# Patient Record
Sex: Female | Born: 1965 | Race: White | Hispanic: No | Marital: Married | Smoking: Never smoker
Health system: Southern US, Community
[De-identification: ages and names within clinical notes are randomized; demographics above are authoritative.]

## PROBLEM LIST (undated history)

## (undated) DIAGNOSIS — R51 Headache: Secondary | ICD-10-CM

## (undated) DIAGNOSIS — R42 Dizziness and giddiness: Secondary | ICD-10-CM

## (undated) DIAGNOSIS — K219 Gastro-esophageal reflux disease without esophagitis: Secondary | ICD-10-CM

## (undated) DIAGNOSIS — A048 Other specified bacterial intestinal infections: Secondary | ICD-10-CM

## (undated) DIAGNOSIS — R519 Headache, unspecified: Secondary | ICD-10-CM

## (undated) DIAGNOSIS — E785 Hyperlipidemia, unspecified: Secondary | ICD-10-CM

## (undated) DIAGNOSIS — E559 Vitamin D deficiency, unspecified: Secondary | ICD-10-CM

## (undated) DIAGNOSIS — N979 Female infertility, unspecified: Secondary | ICD-10-CM

## (undated) DIAGNOSIS — G43909 Migraine, unspecified, not intractable, without status migrainosus: Secondary | ICD-10-CM

## (undated) DIAGNOSIS — H409 Unspecified glaucoma: Secondary | ICD-10-CM

## (undated) HISTORY — DX: Migraine, unspecified, not intractable, without status migrainosus: G43.909

## (undated) HISTORY — DX: Headache: R51

## (undated) HISTORY — DX: Headache, unspecified: R51.9

## (undated) HISTORY — DX: Vitamin D deficiency, unspecified: E55.9

## (undated) HISTORY — DX: Gastro-esophageal reflux disease without esophagitis: K21.9

## (undated) HISTORY — DX: Female infertility, unspecified: N97.9

## (undated) HISTORY — DX: Hyperlipidemia, unspecified: E78.5

## (undated) HISTORY — PX: MANDIBLE SURGERY: SHX707

## (undated) HISTORY — DX: Dizziness and giddiness: R42

## (undated) HISTORY — DX: Unspecified glaucoma: H40.9

## (undated) HISTORY — DX: Hypercalcemia: E83.52

## (undated) HISTORY — PX: HAND SURGERY: SHX662

## (undated) HISTORY — DX: Other specified bacterial intestinal infections: A04.8

---

## 1997-07-30 ENCOUNTER — Other Ambulatory Visit: Admission: RE | Admit: 1997-07-30 | Discharge: 1997-07-30 | Payer: Self-pay | Admitting: Obstetrics and Gynecology

## 1998-09-17 ENCOUNTER — Other Ambulatory Visit: Admission: RE | Admit: 1998-09-17 | Discharge: 1998-09-17 | Payer: Self-pay | Admitting: Obstetrics and Gynecology

## 1999-07-07 ENCOUNTER — Encounter (INDEPENDENT_AMBULATORY_CARE_PROVIDER_SITE_OTHER): Payer: Self-pay | Admitting: Specialist

## 1999-07-07 ENCOUNTER — Ambulatory Visit (HOSPITAL_COMMUNITY): Admission: RE | Admit: 1999-07-07 | Discharge: 1999-07-07 | Payer: Self-pay | Admitting: Oral Surgery

## 1999-09-08 ENCOUNTER — Encounter: Payer: Self-pay | Admitting: Obstetrics and Gynecology

## 1999-09-08 ENCOUNTER — Encounter: Admission: RE | Admit: 1999-09-08 | Discharge: 1999-09-08 | Payer: Self-pay | Admitting: Obstetrics and Gynecology

## 1999-10-29 ENCOUNTER — Other Ambulatory Visit: Admission: RE | Admit: 1999-10-29 | Discharge: 1999-10-29 | Payer: Self-pay | Admitting: Obstetrics and Gynecology

## 2000-03-03 ENCOUNTER — Other Ambulatory Visit: Admission: RE | Admit: 2000-03-03 | Discharge: 2000-03-03 | Payer: Self-pay | Admitting: Orthopedic Surgery

## 2001-05-29 ENCOUNTER — Other Ambulatory Visit: Admission: RE | Admit: 2001-05-29 | Discharge: 2001-05-29 | Payer: Self-pay | Admitting: Obstetrics & Gynecology

## 2001-06-09 ENCOUNTER — Encounter: Payer: Self-pay | Admitting: Obstetrics & Gynecology

## 2001-06-09 ENCOUNTER — Ambulatory Visit (HOSPITAL_COMMUNITY): Admission: RE | Admit: 2001-06-09 | Discharge: 2001-06-09 | Payer: Self-pay | Admitting: Obstetrics & Gynecology

## 2002-05-28 ENCOUNTER — Other Ambulatory Visit: Admission: RE | Admit: 2002-05-28 | Discharge: 2002-05-28 | Payer: Self-pay | Admitting: Obstetrics & Gynecology

## 2003-12-02 ENCOUNTER — Encounter: Admission: RE | Admit: 2003-12-02 | Discharge: 2003-12-02 | Payer: Self-pay | Admitting: Internal Medicine

## 2006-06-30 ENCOUNTER — Ambulatory Visit (HOSPITAL_BASED_OUTPATIENT_CLINIC_OR_DEPARTMENT_OTHER): Admission: RE | Admit: 2006-06-30 | Discharge: 2006-06-30 | Payer: Self-pay | Admitting: Orthopedic Surgery

## 2006-09-08 ENCOUNTER — Ambulatory Visit (HOSPITAL_BASED_OUTPATIENT_CLINIC_OR_DEPARTMENT_OTHER): Admission: RE | Admit: 2006-09-08 | Discharge: 2006-09-08 | Payer: Self-pay | Admitting: Orthopedic Surgery

## 2006-09-08 ENCOUNTER — Encounter (INDEPENDENT_AMBULATORY_CARE_PROVIDER_SITE_OTHER): Payer: Self-pay | Admitting: Orthopedic Surgery

## 2006-10-06 DIAGNOSIS — H409 Unspecified glaucoma: Secondary | ICD-10-CM | POA: Insufficient documentation

## 2006-10-06 DIAGNOSIS — E7800 Pure hypercholesterolemia, unspecified: Secondary | ICD-10-CM | POA: Insufficient documentation

## 2006-10-06 DIAGNOSIS — N979 Female infertility, unspecified: Secondary | ICD-10-CM | POA: Insufficient documentation

## 2006-10-06 HISTORY — DX: Female infertility, unspecified: N97.9

## 2006-10-07 DIAGNOSIS — J309 Allergic rhinitis, unspecified: Secondary | ICD-10-CM | POA: Insufficient documentation

## 2006-10-20 ENCOUNTER — Ambulatory Visit: Payer: Self-pay | Admitting: Family Medicine

## 2008-08-20 ENCOUNTER — Emergency Department (HOSPITAL_COMMUNITY): Admission: EM | Admit: 2008-08-20 | Discharge: 2008-08-21 | Payer: Self-pay | Admitting: Emergency Medicine

## 2008-08-22 ENCOUNTER — Ambulatory Visit: Payer: Self-pay

## 2008-08-31 ENCOUNTER — Ambulatory Visit: Payer: Self-pay | Admitting: Unknown Physician Specialty

## 2008-09-11 ENCOUNTER — Inpatient Hospital Stay: Payer: Self-pay | Admitting: Internal Medicine

## 2008-10-28 ENCOUNTER — Encounter: Payer: Self-pay | Admitting: Neurology

## 2008-10-30 ENCOUNTER — Encounter: Payer: Self-pay | Admitting: Neurology

## 2008-11-29 ENCOUNTER — Encounter: Payer: Self-pay | Admitting: Neurology

## 2008-12-30 ENCOUNTER — Encounter: Payer: Self-pay | Admitting: Neurology

## 2009-01-29 ENCOUNTER — Encounter: Payer: Self-pay | Admitting: Neurology

## 2009-10-20 ENCOUNTER — Other Ambulatory Visit: Admission: RE | Admit: 2009-10-20 | Discharge: 2009-10-20 | Payer: Self-pay | Admitting: Obstetrics and Gynecology

## 2009-11-04 ENCOUNTER — Encounter: Admission: RE | Admit: 2009-11-04 | Discharge: 2009-11-04 | Payer: Self-pay | Admitting: Obstetrics and Gynecology

## 2010-07-14 NOTE — Op Note (Signed)
NAMEJESALYN, Chelsea Jefferson             ACCOUNT NO.:  [id number]   MEDICAL RECORD NO.:  [id number]          PATIENT TYPE:  AMB   LOCATION:  DSC                          FACILITY:  [facility]   PHYSICIAN:  Katy Fitch. Sypher, M.D. DATE OF BIRTH:  April 08, 1965   DATE OF PROCEDURE:  09/08/2006  DATE OF DISCHARGE:                               OPERATIVE REPORT   PREOPERATIVE DIAGNOSIS:  Keloid scar with inclusion cyst, right index  finger, status post excisional biopsy of a vascular mass, right index  finger, performed Jun 30, 2006.   POSTOPERATIVE DIAGNOSIS:  Keloid scar with inclusion cyst, right index  finger, status post excisional biopsy of a vascular mass, right index  finger, performed Jun 30, 2006.   OPERATION:  Full-thickness skin biopsy with removal of keloid scar and  an inclusion cyst, right index finger.   OPERATING SURGEON:  Josephine Igo, MD   ASSISTANT:  Annye Rusk, PA-C   ANESTHESIA:  Lidocaine 2% metacarpal head level block of right index  finger.  No sedation was provided, as this was performed in the minor  operating room.   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  She  subsequently returned with masses on the palmar aspect of her left index  finger at the PIP flexion crease overlying the flexion sheath and the  right index finger at the PIP flexion crease.   She was taken to the minor operating room at [facility] and under  local digital block anesthesia of the right and left index fingers on  Jun 30, 2006, had excisional biopsy of her masses.   In an effort to simplify her postoperative care, we used Vicryl Rapide  as a postoperative suture to close the wounds.   The goal in utilizing this suture was to avoid suture removal from  tender palmar wounds in the office postoperatively.   Unfortunately, Ms.  Jefferson had rather exuberant inflammatory reaction  to the Vicryl Rapide and developed what appeared to be an inclusion cyst  in the right index finger.   She initially brought this to my attention approximately 3 weeks  postoperatively.  I advised observing this for a minimum of 10 weeks.   In the left index finger, the exuberant reaction subsided and she only  has a small amount of dermal thickening at the site of her incision.   In the right index finger, there is a keloid at the apex of the Brunner  zigzag incision utilized to remove the mass at the PIP flexion crease  and what appears to be an inclusion cyst at the site of one Vicryl  Rapide sutures.   This was bothersome to Chelsea Jefferson and she requested that this be  resected.   Once again, we return to the minor operating room.   At this point in time, I advised her I would close her wound with either  a Steri-Strip or a intradermal Prolene suture.   After informed  consent, she is brought to the operating room at this  time.   Preoperatively, she was reminded that we cannot control keloid  formation.  It does appear, however, that she had a rather unusual  response to the Vicryl suture.  We intend to biopsy the lesion at this  time.   PROCEDURE:  Chelsea Jefferson was brought to the minor operating room and  placed in supine position upon the operating table.   Following placement of a 2% lidocaine metacarpal head level block,  excellent anesthesia of the right index finger was obtained.   The right hand and forearm were prepped with Betadine soap and solution  and sterilely draped.  A pneumatic tourniquet was applied to the  proximal forearm.   On exsanguination of the right hand and arm by direct compression, the  tourniquet was initially elevated to 240 mmHg and later to 260 mmHg due  to breakthrough bleeding.   An elliptical excision of the keloid and cyst was accomplished with a 15  scalpel blade, taking care  to identify the ulnar proper digital nerve,  which was subjacent to the mass.  The full-thickness skin and the keloid  and cysts were excised en bloc followed by careful inspection of the  ulnar proper digital nerve and vessel.   The wound margins were mobilized with gentle tenotomy scissors  dissection followed by repair the skin wound with an intradermal 4-0  Prolene suture and an half-inch Steri-Strip.   The wound was dressed with sterile gauze and Coban.  There were no  apparent complications.   Chelsea Jefferson tolerated the procedure well.  She was transferred back to  the holding area for observation of her vital signs, anticipating  discharge home.   She chooses to use over-the-counter analgesic medication rather than a  prescription for postoperative pain.      Katy Fitch Sypher, M.D.  Electronically Signed     RVS/MEDQ  D:  09/08/2006  T:  09/09/2006  Job:  [id number]

## 2010-12-19 IMAGING — CT CT HEAD WITHOUT AND WITH CONTRAST
1 of 2 series · 13 of 30 positions shown, 17 images · non-contrast
Comparison: none

REASON FOR EXAM: [id number] vertigo nausea vomiting eval tumor preg test
negative
COMMENTS:

[Series 2: soft tissue wo · axial · 0.38mm/px · z∈[+687,+807]mm · 13 of 28 slices shown, 17 images]
[im 2/28  brain]
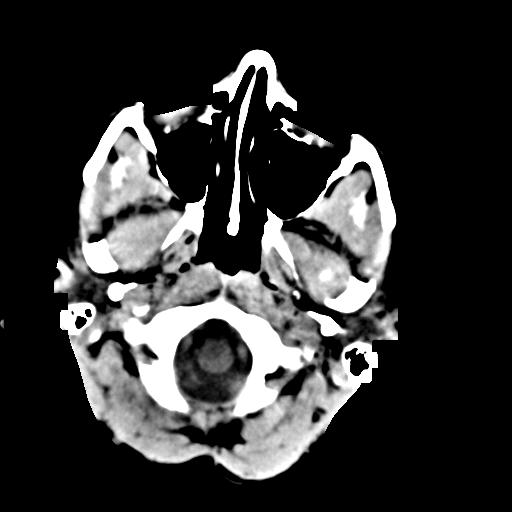
[im 2/28  bone]
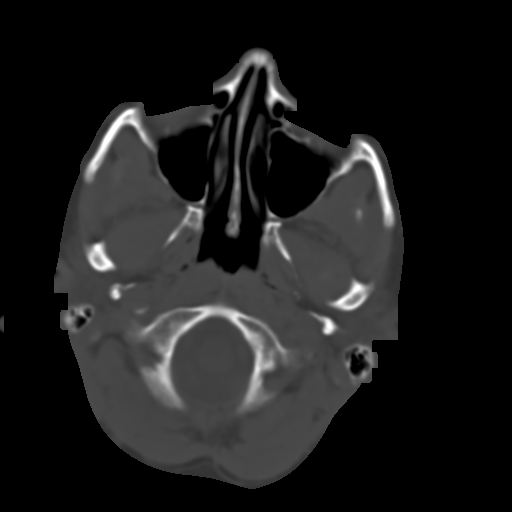
[im 4/28  brain]
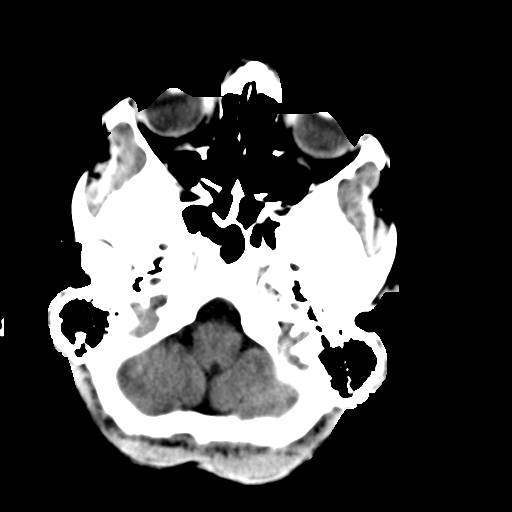
[im 6/28  brain]
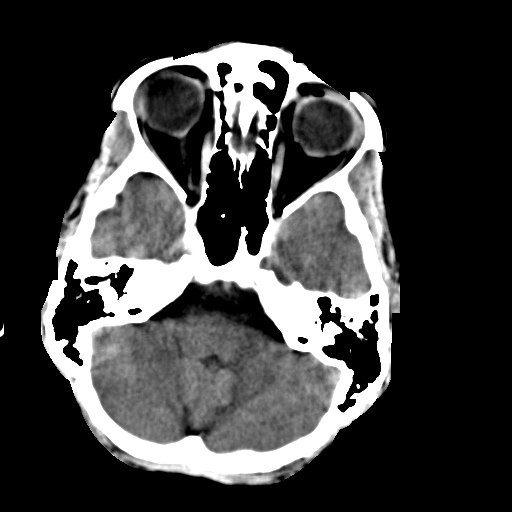
[im 8/28  brain]
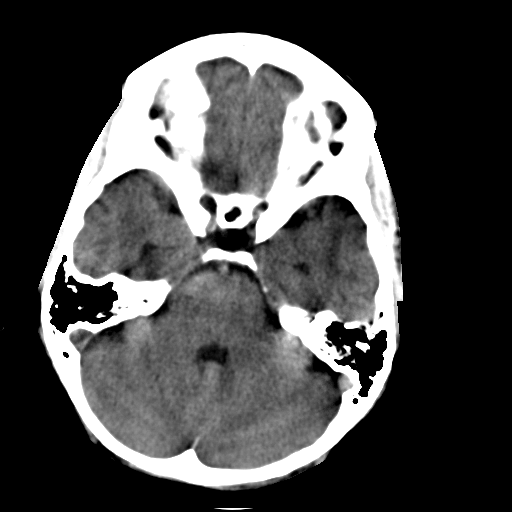
[im 10/28  brain]
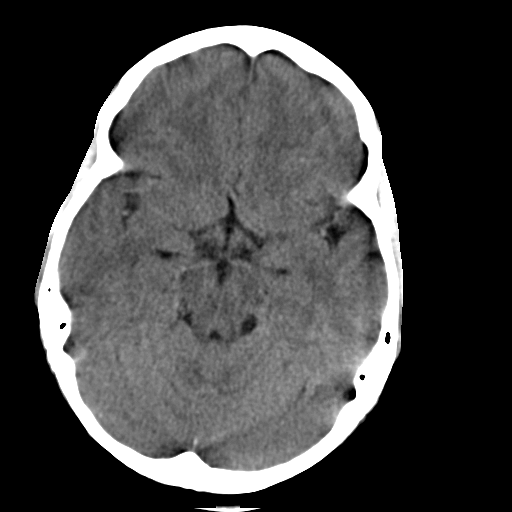
[im 10/28  bone]
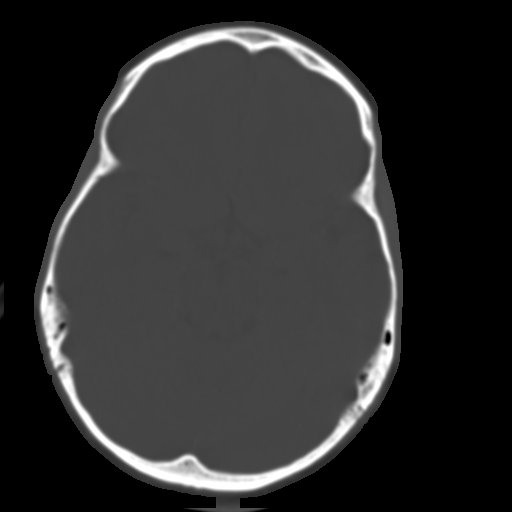
[im 12/28  brain]
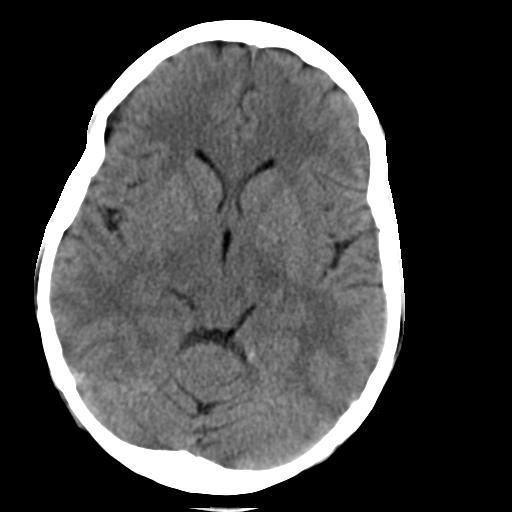
[im 14/28  brain]
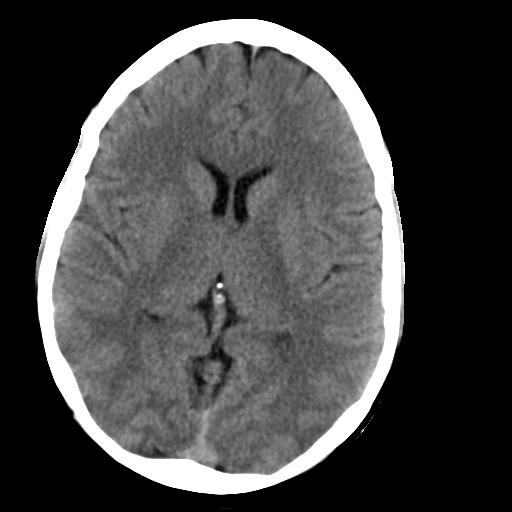
[im 16/28  brain]
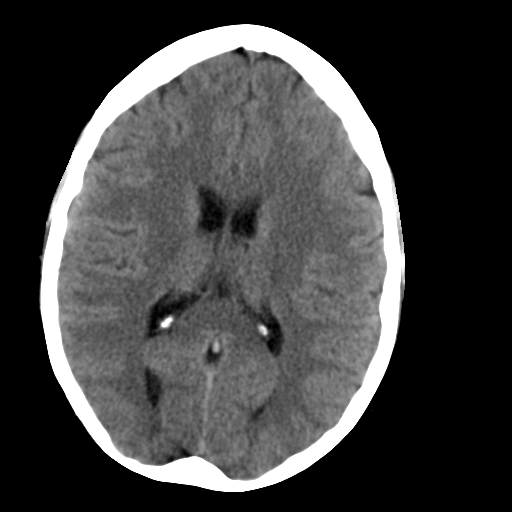
[im 18/28  brain]
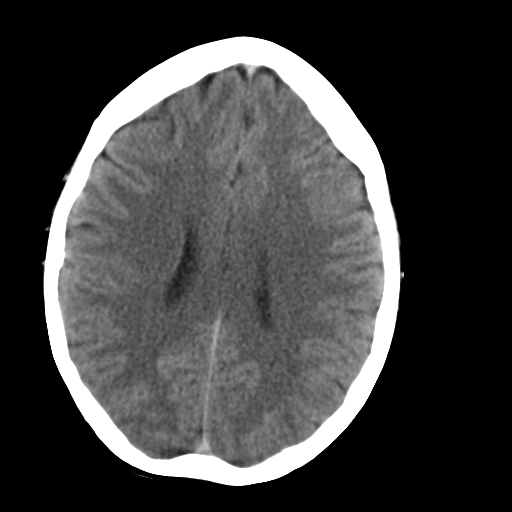
[im 18/28  bone]
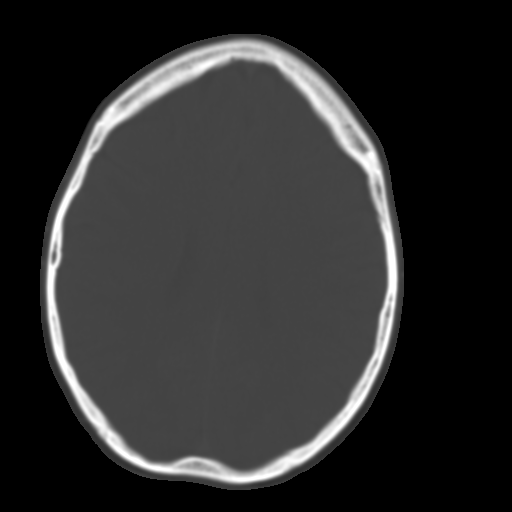
[im 20/28  brain]
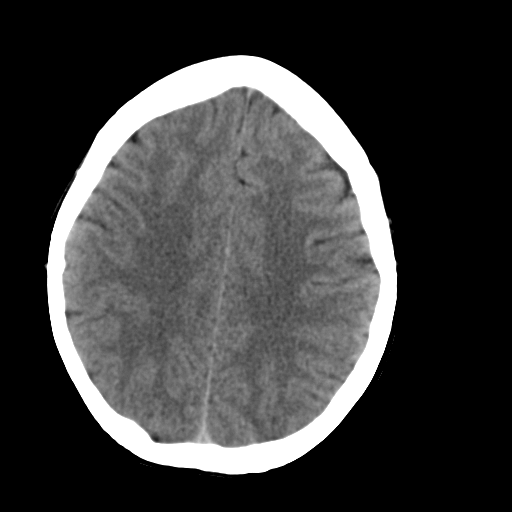
[im 22/28  brain]
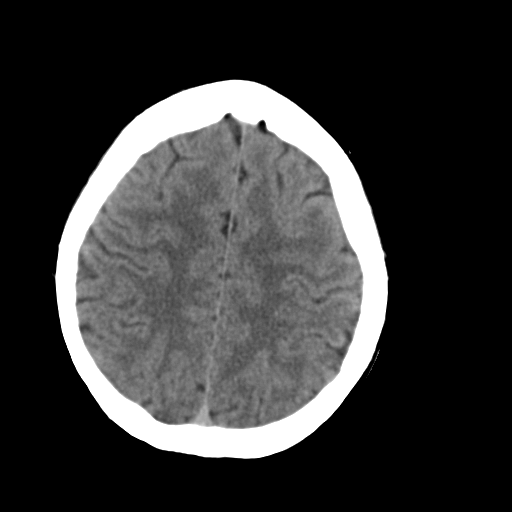
[im 24/28  brain]
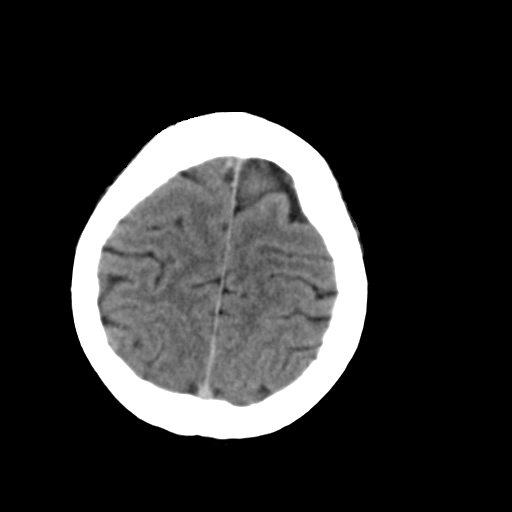
[im 26/28  brain]
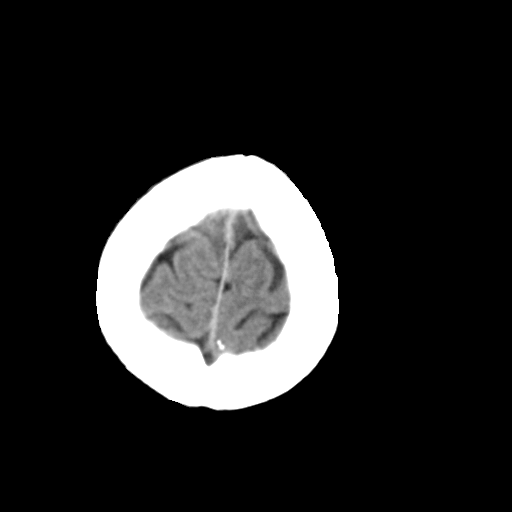
[im 26/28  bone]
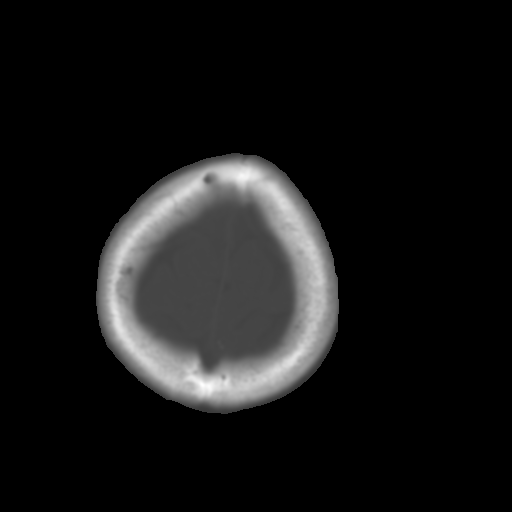

[13 of 30 positions shown; findings below may reference images not displayed]

PROCEDURE:     AUJLA - JOZOTRIM BAGISLAR/ANA FULARIANI  - August 22, 2008  [DATE]

RESULT:     History: Vertigo.

Comparison studies no prior.

Procedure and findings: No intra-axial or extra-axial pathologic fluid or
blood collections identified. No mass lesions noted. No hydrocephalus. No
enhancing lesions noted. No bony abnormalities noted.
IMPRESSION: No acute abnormality. No mass lesion or enhancing lesion.

## 2012-10-27 ENCOUNTER — Ambulatory Visit (INDEPENDENT_AMBULATORY_CARE_PROVIDER_SITE_OTHER): Payer: 59 | Admitting: Family Medicine

## 2012-10-27 ENCOUNTER — Encounter: Payer: Self-pay | Admitting: Family Medicine

## 2012-10-27 VITALS — BP 144/98 | HR 73 | Ht 63.0 in | Wt 146.0 lb

## 2012-10-27 DIAGNOSIS — Z803 Family history of malignant neoplasm of breast: Secondary | ICD-10-CM | POA: Insufficient documentation

## 2012-10-27 DIAGNOSIS — R42 Dizziness and giddiness: Secondary | ICD-10-CM | POA: Insufficient documentation

## 2012-10-27 NOTE — Progress Notes (Signed)
Patient ID: NHYLA NAPPI, female   DOB: 1965/05/29, 47 y.o.   MRN: [id number] Pt. Is new today a G0 and comes in seeking a third opinion.  Apparently, saw OB/GYN # 1 for annual exam with nml pap.  While there, felt pelvic mass and scheduled pt. For u/s.  U/s revealed nml ovarian cyst and ? Thickened endometrium at 8 mm on day 12 of cycle.  For this reason, pt. Scheduled for EMB.  OB/GYN #1 could not obtain sample and tried for u/s guidance. On that u/s noted "cervical/uterine mass 2 x  4 cm and advised to have hysterectomy.  Pt. Reports regular cycles which have gotten closer in length q 21-23 days but are very light. Pt. Was uncomfortable with this recommendation and opted for 2nd opinion.   OB/GYN # 2 saw pt in office and performed 3rd pelvic sono with endometrial strip of 6 mm and no mass noted.  OB/GYN # 2 advised no further work-up warranted. OB/GYN # 1 has called pt's home and advised again she undergo hysterectomy.  Since opinion #1 was so divergent from opinion #2, pt comes in for 3rd opinion as a tie breaker.  I have reviewed and updated the patient's past medical, surgical, social, OB, family history.  We reviewed medications and allergies.  After hearing the story, I have a similar opinion to OB/GYN #2.  Without complaints of heavy menstrual bleeding, EMB is usually not warranted. An endometrial thickness of 6-8 mm in a pre-menopausal patient is within the range of normal.  2 ultrasounds failed to show a cervical/uterine mass.  Hysterectomy would not be warranted.  If there is fear of mass, which there isn't, hysteroscopy with sampling could be performed to ensure there was not any cancer, which would need referral to GYN/Onc.  Cycle length typically begins to vary in the mid-forties as part of the peri-menopausal hormonal fluctuations and cycle shortening is often the first sign of this.  I have discussed this at length with the patient today.  I do not think further imaging or work-up is  warranted at this time.  She should obtain her annual mammogram.

## 2012-10-27 NOTE — Patient Instructions (Signed)
Perimenopause Perimenopause is the time when your body begins to move into the menopause (no menstrual period for 12 straight months). It is a natural process. Perimenopause can begin 2 to 8 years before the menopause and usually lasts for one year after the menopause. During this time, your ovaries may or may not produce an egg. The ovaries vary in their production of estrogen and progesterone hormones each month. This can cause irregular menstrual periods, difficulty in getting pregnant, vaginal bleeding between periods and uncomfortable symptoms. CAUSES  Irregular production of the ovarian hormones, estrogen and progesterone, and not ovulating every month.  Other causes include:  Tumor of the pituitary gland in the brain.  Medical disease that affects the ovaries.  Radiation treatment.  Chemotherapy.  Unknown causes.  Heavy smoking and excessive alcohol intake can bring on perimenopause sooner. SYMPTOMS   Hot flashes.  Night sweats.  Irregular menstrual periods.  Decrease sex drive.  Vaginal dryness.  Headaches.  Mood swings.  Depression.  Memory problems.  Irritability.  Tiredness.  Weight gain.  Trouble getting pregnant.  The beginning of losing bone cells (osteoporosis).  The beginning of hardening of the arteries (atherosclerosis). DIAGNOSIS  Your caregiver will make a diagnosis by analyzing your age, menstrual history and your symptoms. They will do a physical exam noting any changes in your body, especially your female organs. Female hormone tests may or may not be helpful depending on the amount and when you produce the female hormones. However, other hormone tests may be helpful (ex. thyroid hormone) to rule out other problems. TREATMENT  The decision to treat during the perimenopause should be made by you and your caregiver depending on how the symptoms are affecting you and your life style. There are various treatments available such as:  Treating  individual symptoms with a specific medication for that symptom (ex. tranquilizer for depression).  Herbal medications that can help specific symptoms.  Counseling.  Group therapy.  No treatment. HOME CARE INSTRUCTIONS   Before seeing your caregiver, make a list of your menstrual periods (when the occur, how heavy they are, how long between periods and how long they last), your symptoms and when they started.  Take the medication as recommended by your caregiver.  Sleep and rest.  Exercise.  Eat a diet that contains calcium (good for your bones) and soy (acts like estrogen hormone).  Do not smoke.  Avoid alcoholic beverages.  Taking vitamin E may help in certain cases.  Take calcium and vitamin D supplements to help prevent bone loss.  Group therapy is sometimes helpful.  Acupuncture may help in some cases. SEEK MEDICAL CARE IF:   You have any of the above and want to know if it is perimenopause.  You want advice and treatment for any of your symptoms mentioned above.  You need a referral to a specialist (gynecologist, psychiatrist or psychologist). SEEK IMMEDIATE MEDICAL CARE IF:   You have vaginal bleeding.  Your period lasts longer than 8 days.  You periods are recurring sooner than 21 days.  You have bleeding after intercourse.  You have severe depression.  You have pain when you urinate.  You have severe headaches.  You develop vision problems. Document Released: 03/25/2004 Document Revised: 05/10/2011 Document Reviewed: 12/14/2007 ExitCare Patient Information 2014 [employer].  

## 2014-01-17 LAB — BASIC METABOLIC PANEL
BUN: 9 mg/dL (ref 4–21)
Creatinine: 0.9 mg/dL (ref 0.5–1.1)
Glucose: 117 mg/dL
Potassium: 4.2 mmol/L (ref 3.4–5.3)
SODIUM: 138 mmol/L (ref 137–147)

## 2014-01-17 LAB — HEPATIC FUNCTION PANEL
ALT: 10 U/L (ref 7–35)
AST: 12 U/L — AB (ref 13–35)

## 2014-01-17 LAB — CBC AND DIFFERENTIAL
HEMATOCRIT: 45 % (ref 36–46)
HEMOGLOBIN: 15.9 g/dL (ref 12.0–16.0)
PLATELETS: 286 10*3/uL (ref 150–399)
WBC: 8.7 10^3/mL

## 2014-01-21 ENCOUNTER — Ambulatory Visit: Payer: Self-pay | Admitting: Family Medicine

## 2014-12-04 ENCOUNTER — Other Ambulatory Visit: Payer: Self-pay | Admitting: Family Medicine

## 2014-12-04 NOTE — Telephone Encounter (Signed)
Controlled, needs your approval.  Thanks  ED

## 2014-12-04 NOTE — Telephone Encounter (Signed)
Last appointment or next appointment?

## 2014-12-06 NOTE — Telephone Encounter (Signed)
LOV was 05/2014 for acute visit and the time before November 2015 for acute visit, no future appointments in the books-aa

## 2014-12-06 NOTE — Telephone Encounter (Signed)
Needs appointment

## 2014-12-16 ENCOUNTER — Other Ambulatory Visit: Payer: Self-pay | Admitting: Family Medicine

## 2014-12-16 NOTE — Telephone Encounter (Signed)
controlled 

## 2014-12-17 NOTE — Telephone Encounter (Signed)
Last appt:   Next appt:

## 2014-12-17 NOTE — Telephone Encounter (Signed)
Last appointment was in April, no future appointment on books.

## 2015-05-20 ENCOUNTER — Encounter: Payer: Self-pay | Admitting: Obstetrics and Gynecology

## 2015-05-20 ENCOUNTER — Other Ambulatory Visit: Payer: Self-pay | Admitting: Obstetrics and Gynecology

## 2015-05-20 ENCOUNTER — Ambulatory Visit (INDEPENDENT_AMBULATORY_CARE_PROVIDER_SITE_OTHER): Payer: BLUE CROSS/BLUE SHIELD | Admitting: Obstetrics and Gynecology

## 2015-05-20 VITALS — BP 132/98 | HR 98 | Ht 63.0 in | Wt 154.4 lb

## 2015-05-20 DIAGNOSIS — Z01419 Encounter for gynecological examination (general) (routine) without abnormal findings: Secondary | ICD-10-CM

## 2015-05-20 DIAGNOSIS — N951 Menopausal and female climacteric states: Secondary | ICD-10-CM

## 2015-05-20 DIAGNOSIS — H8190 Unspecified disorder of vestibular function, unspecified ear: Secondary | ICD-10-CM | POA: Diagnosis not present

## 2015-05-20 DIAGNOSIS — E663 Overweight: Secondary | ICD-10-CM

## 2015-05-20 NOTE — Patient Instructions (Signed)
  Place annual gynecologic exam patient instructions here.  Thank you for enrolling in MyChart. Please follow the instructions below to securely access your online medical record. MyChart allows you to send messages to your doctor, view your test results, manage appointments, and more.   How Do I Sign Up? 1. In your Internet browser, go to [employer] Address [employer] and enter [email or web address]. 2. Click on the Sign Up Now link in the Sign In box. You will see the New Member Sign Up page. 3. Enter your MyChart Access Code exactly as it appears below. You will not need to use this code after you've completed the sign-up process. If you do not sign up before the expiration date, you must request a new code.  MyChart Access Code: [email or web address] Expires: 06/09/2015  3:04 PM  4. Enter your Social Security Number ([id number]) and Date of Birth (mm/dd/yyyy) as indicated and click Submit. You will be taken to the next sign-up page. 5. Create a MyChart ID. This will be your MyChart login ID and cannot be changed, so think of one that is secure and easy to remember. 6. Create a MyChart password. You can change your password at any time. 7. Enter your Password Reset Question and Answer. This can be used at a later time if you forget your password.  8. Enter your e-mail address. You will receive e-mail notification when new information is available in MyChart. 9. Click Sign Up. You can now view your medical record.   Additional Information Remember, MyChart is NOT to be used for urgent needs. For medical emergencies, dial 911.

## 2015-05-20 NOTE — Progress Notes (Signed)
Subjective:   Chelsea Jefferson is a 50 y.o. G0P0000 Caucasian female here for a routine well-woman exam.  Patient's last menstrual period was 05/07/2015.    Current complaints: severe vertigo- unknown etiology- now OOW, but homeschooling adopted daughter PCP: none       does desire labs  Social History: Sexual: heterosexual Marital Status: married Living situation: with spouse Occupation: Architectural technologistelementary ed teacher Tobacco/alcohol: no tobacco use Illicit drugs: no history of illicit drug use  The following portions of the patient's history were reviewed and updated as appropriate: allergies, current medications, past family history, past medical history, past social history, past surgical history and problem list.  Past Medical History Past Medical History  Diagnosis Date  . Headache   . Migraine   . Vertigo     Past Surgical History Past Surgical History  Procedure Laterality Date  . Mandible surgery    . Hand surgery      Gynecologic History G0P0000  Patient's last menstrual period was 05/07/2015. Contraception: none Last Pap: 2014. Results were: normal Last mammogram: 2014. Results were: normal, with increased fibrocystic tissue- been doing thermagraphic exams yearly  Obstetric History OB History  Gravida Para Term Preterm AB SAB TAB Ectopic Multiple Living  0 0 0 0 0 0 0 0 0 0         Current Medications Current Outpatient Prescriptions on File Prior to Visit  Medication Sig Dispense Refill  . aspirin-acetaminophen-caffeine (EXCEDRIN MIGRAINE) 250-250-65 MG per tablet Take 1 tablet by mouth every 6 (six) hours as needed for pain.    Marland Kitchen. LORazepam (ATIVAN) 0.5 MG tablet TAKE 1/2 TO 1 TABLET BY MOUTH TWICE A DAY AS NEEDED 60 tablet 0   No current facility-administered medications on file prior to visit.    Review of Systems Patient denies any headaches, blurred vision, shortness of breath, chest pain, abdominal pain, problems with bowel movements, urination, or  intercourse.  Objective:  BP 132/98 mmHg  Pulse 98  Ht 5\' 3"  (1.6 m)  Wt 154 lb 6.4 oz (70.035 kg)  BMI 27.36 kg/m2  LMP 05/07/2015 Physical Exam  General:  Well developed, well nourished, no acute distress. She is alert and oriented x3. Skin:  Warm and dry Neck:  Midline trachea, no thyromegaly or nodules Cardiovascular: Regular rate and rhythm, no murmur heard Lungs:  Effort normal, all lung fields clear to auscultation bilaterally Breasts:  No dominant palpable mass, retraction, or nipple discharge Abdomen:  Soft, non tender, no hepatosplenomegaly or masses Pelvic:  External genitalia is normal in appearance.  The vagina is normal in appearance. The cervix is bulbous, no CMT.  Thin prep pap is done with HR HPV cotesting. Uterus is felt to be normal size, shape, retroflexed .  No adnexal masses or tenderness noted. Extremities:  No swelling or varicosities noted Psych:  She has a normal mood and affect  Assessment:   Healthy well-woman exam Overweight Vertigo H/o vitamin d deficiency Family h/o breast cancer in sister & prostate cancer in brother Peri-menopausal bleeding  Plan:  Labs irdered Discussed MyRisks testing- will consider. F/U 1 year for AE, or sooner if needed Mammogram 3D scheduled  Bryor Rami Suzan NailerN Krista Godsil, CNM

## 2015-05-21 LAB — CYTOLOGY - PAP

## 2015-06-27 ENCOUNTER — Ambulatory Visit
Admission: RE | Admit: 2015-06-27 | Discharge: 2015-06-27 | Disposition: A | Discharge status: Home / Self Care | Payer: BLUE CROSS/BLUE SHIELD | Source: Ambulatory Visit | Attending: Obstetrics and Gynecology | Admitting: Obstetrics and Gynecology

## 2015-06-27 ENCOUNTER — Other Ambulatory Visit: Payer: Self-pay | Admitting: Obstetrics and Gynecology

## 2015-06-27 DIAGNOSIS — Z01419 Encounter for gynecological examination (general) (routine) without abnormal findings: Secondary | ICD-10-CM

## 2015-06-27 DIAGNOSIS — Z1231 Encounter for screening mammogram for malignant neoplasm of breast: Secondary | ICD-10-CM

## 2015-07-22 ENCOUNTER — Encounter: Payer: Self-pay | Admitting: Physician Assistant

## 2015-07-22 ENCOUNTER — Ambulatory Visit (INDEPENDENT_AMBULATORY_CARE_PROVIDER_SITE_OTHER): Payer: BLUE CROSS/BLUE SHIELD | Admitting: Physician Assistant

## 2015-07-22 VITALS — BP 130/80 | HR 72 | Temp 98.2°F | Resp 16 | Wt 141.6 lb

## 2015-07-22 DIAGNOSIS — E559 Vitamin D deficiency, unspecified: Secondary | ICD-10-CM | POA: Insufficient documentation

## 2015-07-22 DIAGNOSIS — K2100 Gastro-esophageal reflux disease with esophagitis, without bleeding: Secondary | ICD-10-CM | POA: Insufficient documentation

## 2015-07-22 DIAGNOSIS — A048 Other specified bacterial intestinal infections: Secondary | ICD-10-CM | POA: Insufficient documentation

## 2015-07-22 DIAGNOSIS — R42 Dizziness and giddiness: Secondary | ICD-10-CM

## 2015-07-22 MED ORDER — LORAZEPAM 0.5 MG PO TABS: ORAL_TABLET | ORAL | Status: DC

## 2015-07-22 NOTE — Patient Instructions (Signed)

## 2015-07-22 NOTE — Progress Notes (Signed)
       Patient: Chelsea Jefferson Female    DOB: November 12, 1965   50 y.o.   MRN: [id number] Visit Date: 07/22/2015  Today's Provider: Margaretann LovelessJennifer M Burnette, PA-C   No chief complaint on file.  Subjective:    HPIPatient here for Medication Refill on Lorazepam.  Vertigo - Dizziness: Patient is here for reill on the Lorazepam that she uses for vertigo to help her drive/daily function.  The dizziness has been present for daily it depends on the day but the medicine helps 80% of time. Sometimes she takes only half of the pills other one tablet. The patient describes the symptoms as disequalibirum and lightheadedness. Symptoms are exacerbated by none identified.   She has had this issue for 7 years. She has went through multiple tests and has completed vestibular rehab without relief.   No Known Allergies Previous Medications   ASPIRIN-ACETAMINOPHEN-CAFFEINE (EXCEDRIN MIGRAINE) 250-250-65 MG PER TABLET    Take 1 tablet by mouth every 6 (six) hours as needed for pain.   BIMATOPROST (LUMIGAN) 0.03 % OPHTHALMIC SOLUTION    Reported on 07/22/2015   LORAZEPAM (ATIVAN) 0.5 MG TABLET    TAKE 1/2 TO 1 TABLET BY MOUTH TWICE A DAY AS NEEDED   MULTIPLE VITAMIN PO    Take by mouth.    Review of Systems  Constitutional: Negative.   Respiratory: Negative for cough, chest tightness, shortness of breath and wheezing.   Cardiovascular: Negative for chest pain, palpitations and leg swelling.  Gastrointestinal: Negative for nausea, vomiting and abdominal pain.  Neurological: Positive for dizziness (just a little ttok half the pill). Negative for light-headedness and headaches.  Psychiatric/Behavioral: The patient is nervous/anxious (only when vertigo episodes hit and dont respond to medication).     Social History  Substance Use Topics  . Smoking status: Never Smoker   . Smokeless tobacco: Never Used  . Alcohol Use: No   Objective:   BP 130/80 mmHg  Pulse 72  Temp(Src) 98.2 F (36.8 C) (Oral)  Resp 16   Wt 141 lb 9.6 oz (64.229 kg)  LMP 07/20/2015  Physical Exam  Constitutional: She is oriented to person, place, and time. She appears well-developed and well-nourished. No distress.  Neck: Normal range of motion. Neck supple.  Cardiovascular: Normal rate, regular rhythm and normal heart sounds.  Exam reveals no gallop and no friction rub.   No murmur heard. Pulmonary/Chest: Effort normal and breath sounds normal. No respiratory distress. She has no wheezes. She has no rales.  Neurological: She is alert and oriented to person, place, and time. No cranial nerve deficit. Coordination normal.  Skin: She is not diaphoretic.  Vitals reviewed.       Assessment & Plan:     1. Vertigo Refilled medication as below. Continue current medical treatment plan. Call if symptoms worsen or if acute issues, questions or concerns arise.  - LORazepam (ATIVAN) 0.5 MG tablet; Take 1/2 tab to 1 tab PO prn vertigo  Dispense: 60 tablet; Refill: 5       Margaretann LovelessJennifer M Burnette, PA-C  [facility]

## 2016-01-19 ENCOUNTER — Other Ambulatory Visit: Payer: Self-pay | Admitting: Family Medicine

## 2016-01-19 DIAGNOSIS — R42 Dizziness and giddiness: Secondary | ICD-10-CM

## 2016-01-19 MED ORDER — LORAZEPAM 0.5 MG PO TABS: ORAL_TABLET | ORAL | 5 refills | Status: DC

## 2016-01-19 NOTE — Telephone Encounter (Signed)
Pt needs a new refill on her   LORazepam (ATIVAN) 0.5 MG tablet  She uses [facility]  Refills that exist but expired.  Thanks Barth Kirkseri

## 2016-01-19 NOTE — Telephone Encounter (Signed)
[facility] 

## 2016-01-19 NOTE — Telephone Encounter (Signed)
Last ov 07/22/2015

## 2016-05-21 ENCOUNTER — Encounter: Payer: BLUE CROSS/BLUE SHIELD | Admitting: Obstetrics and Gynecology

## 2016-09-24 ENCOUNTER — Encounter: Payer: Self-pay | Admitting: Physician Assistant

## 2016-09-24 ENCOUNTER — Ambulatory Visit (INDEPENDENT_AMBULATORY_CARE_PROVIDER_SITE_OTHER): Payer: BLUE CROSS/BLUE SHIELD | Admitting: Physician Assistant

## 2016-09-24 DIAGNOSIS — R42 Dizziness and giddiness: Secondary | ICD-10-CM | POA: Diagnosis not present

## 2016-09-24 MED ORDER — LORAZEPAM 0.5 MG PO TABS: ORAL_TABLET | ORAL | 5 refills | Status: DC

## 2016-09-24 NOTE — Progress Notes (Signed)
       Patient: Chelsea CallasDeanne C Jefferson Female    DOB: Jul 10, 1965   51 y.o.   MRN: [id number] Visit Date: 09/24/2016  Today's Provider: Margaretann LovelessJennifer M Anmarie Fukushima, PA-C   Chief Complaint  Patient presents with  . Follow-up   Subjective:    HPI Patient comes in today for a follow up. She was last seen in the office 1 year ago. Patient reports that she needs a refill on lorazepam 0.5mg  tablet. Patient states that [place] is moving to [place] next week and she will need a hard copy until she establishes with a primary doctor there.   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 She normally deals with baseline vertigo, but will take the lorazepam when she is having really bad days.     No Known Allergies   Current Outpatient Prescriptions:  .  LORazepam (ATIVAN) 0.5 MG tablet, Take 1/2 tab to 1 tab PO prn vertigo, Disp: 60 tablet, Rfl: 5 .  MULTIPLE VITAMIN PO, Take by mouth., Disp: , Rfl:  .  aspirin-acetaminophen-caffeine (EXCEDRIN MIGRAINE) 250-250-65 MG per tablet, Take 1 tablet by mouth every 6 (six) hours as needed for pain., Disp: , Rfl:  .  bimatoprost (LUMIGAN) 0.03 % ophthalmic solution, Reported on 07/22/2015, Disp: , Rfl:   Review of Systems  Constitutional: Negative.   Respiratory: Negative.   Cardiovascular: Negative.   Gastrointestinal: Negative.   Neurological: Positive for dizziness.  Psychiatric/Behavioral: Negative.     Social History  Substance Use Topics  . Smoking status: Never Smoker  . Smokeless tobacco: Never Used  . Alcohol use No   Objective:   BP 140/82   Temp 98.6 F (37 C)   Resp 16   Wt 134 lb (60.8 kg)   BMI 23.74 kg/m  Vitals:   09/24/16 1129  BP: 140/82  Resp: 16  Temp: 98.6 F (37 C)  Weight: 134 lb (60.8 kg)     Physical Exam  Constitutional: She is oriented to person, place, and time. She appears well-developed and well-nourished. No distress.  Neck: Normal range of motion. Neck supple.    Cardiovascular: Normal rate, regular rhythm and normal heart sounds.  Exam reveals no gallop and no friction rub.   No murmur heard. Pulmonary/Chest: Effort normal and breath sounds normal. No respiratory distress. She has no wheezes. She has no rales.  Neurological: She is alert and oriented to person, place, and time. No cranial nerve deficit.  Skin: She is not diaphoretic.  Vitals reviewed.       Assessment & Plan:     1. Vertigo Stable. Diagnosis pulled for medication refill. Continue current medical treatment plan. Patient is going to establish care [place]. - LORazepam (ATIVAN) 0.5 MG tablet; Take 1/2 tab to 1 tab PO BID prn vertigo  Dispense: 60 tablet; Refill: 5       Margaretann LovelessJennifer M Keno Caraway, PA-C  [facility]

## 2016-09-24 NOTE — Patient Instructions (Signed)
Dizziness Dizziness is a common problem. It is a feeling of unsteadiness or light-headedness. You may feel like you are about to faint. Dizziness can lead to injury if you stumble or fall. Anyone can become dizzy, but dizziness is more common in older adults. This condition can be caused by a number of things, including medicines, dehydration, or illness. Follow these instructions at home: Taking these steps may help with your condition: Eating and drinking   Drink enough fluid to keep your urine clear or pale yellow. This helps to keep you from becoming dehydrated. Try to drink more clear fluids, such as water.  Do not drink alcohol.  Limit your caffeine intake if directed by your health care provider.  Limit your salt intake if directed by your health care provider. Activity   Avoid making quick movements.  Rise slowly from chairs and steady yourself until you feel okay.  In the morning, first sit up on the side of the bed. When you feel okay, stand slowly while you hold onto something until you know that your balance is fine.  Move your legs often if you need to stand in one place for a long time. Tighten and relax your muscles in your legs while you are standing.  Do not drive or operate heavy machinery if you feel dizzy.  Avoid bending down if you feel dizzy. Place items in your home so that they are easy for you to reach without leaning over. Lifestyle   Do not use any tobacco products, including cigarettes, chewing tobacco, or electronic cigarettes. If you need help quitting, ask your health care provider.  Try to reduce your stress level, such as with yoga or meditation. Talk with your health care provider if you need help. General instructions   Watch your dizziness for any changes.  Take medicines only as directed by your health care provider. Talk with your health care provider if you think that your dizziness is caused by a medicine that you are taking.  Tell a friend  or a family member that you are feeling dizzy. If he or she notices any changes in your behavior, have this person call your health care provider.  Keep all follow-up visits as directed by your health care provider. This is important. Contact a health care provider if:  Your dizziness does not go away.  Your dizziness or light-headedness gets worse.  You feel nauseous.  You have reduced hearing.  You have new symptoms.  You are unsteady on your feet or you feel like the room is spinning. Get help right away if:  You vomit or have diarrhea and are unable to eat or drink anything.  You have problems talking, walking, swallowing, or using your arms, hands, or legs.  You feel generally weak.  You are not thinking clearly or you have trouble forming sentences. It may take a friend or family member to notice this.  You have chest pain, abdominal pain, shortness of breath, or sweating.  Your vision changes.  You notice any bleeding.  You have a headache.  You have neck pain or a stiff neck.  You have a fever. This information is not intended to replace advice given to you by your health care provider. Make sure you discuss any questions you have with your health care provider. Document Released: 08/11/2000 Document Revised: 07/24/2015 Document Reviewed: 02/11/2014 [employer] Interactive Patient Education  2017 [employer]  

## 2017-04-12 ENCOUNTER — Other Ambulatory Visit: Payer: Self-pay | Admitting: Physician Assistant

## 2017-04-12 DIAGNOSIS — R42 Dizziness and giddiness: Secondary | ICD-10-CM

## 2017-04-12 NOTE — Telephone Encounter (Signed)
Called into [facility] 

## 2017-10-12 ENCOUNTER — Other Ambulatory Visit: Payer: Self-pay | Admitting: Physician Assistant

## 2017-10-12 DIAGNOSIS — R42 Dizziness and giddiness: Secondary | ICD-10-CM

## 2017-10-12 NOTE — Telephone Encounter (Signed)
Patient needs appt

## 2017-10-23 IMAGING — MG MM SCREENING BREAST TOMO BILATERAL
8 of 12 series · 8 of 28 positions shown · non-contrast
Comparison: Previous exam(s).

CLINICAL DATA: Screening.

EXAM:
2D DIGITAL SCREENING BILATERAL MAMMOGRAM WITH CAD AND ADJUNCT TOMO

[R MLO]
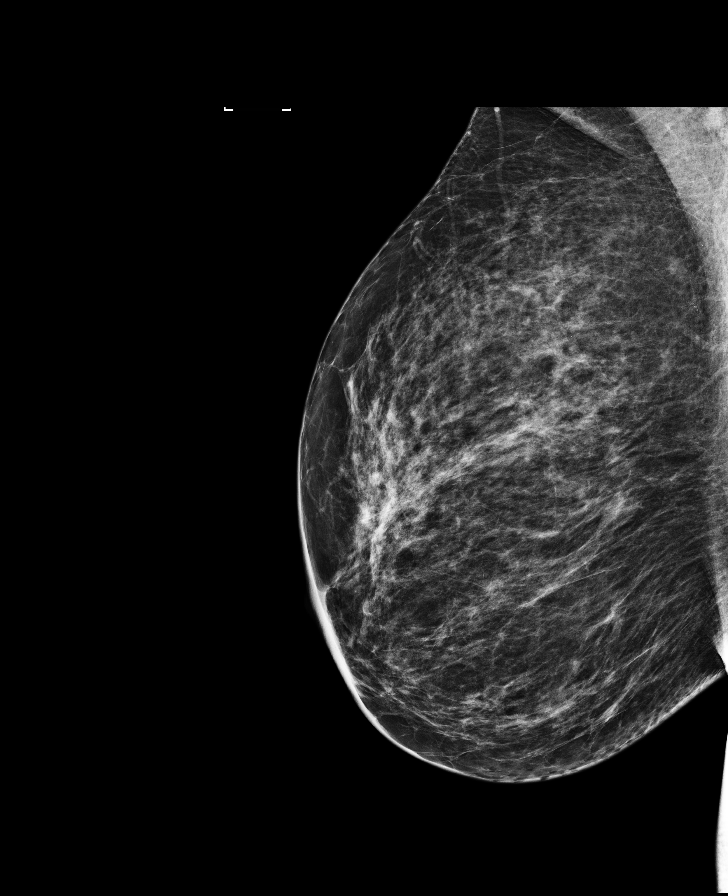

[L MLO synth-2D]
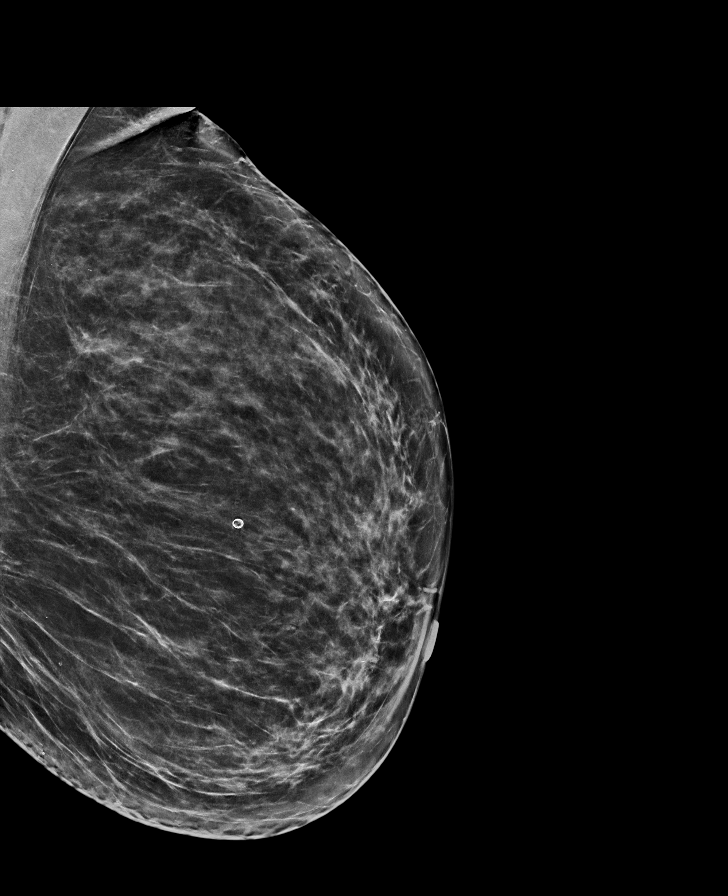

[L MLO]
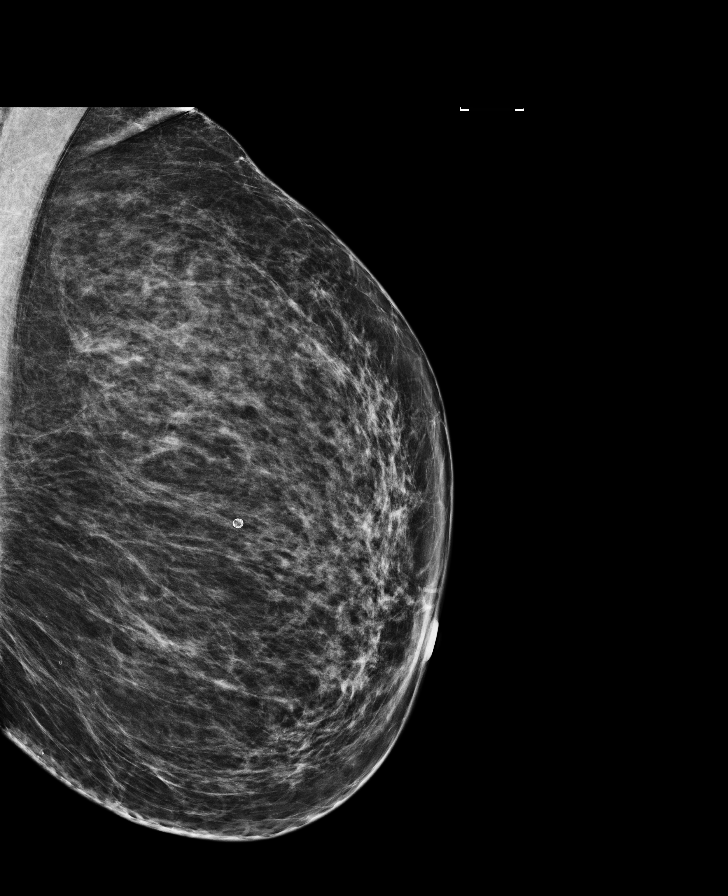

[R CC]
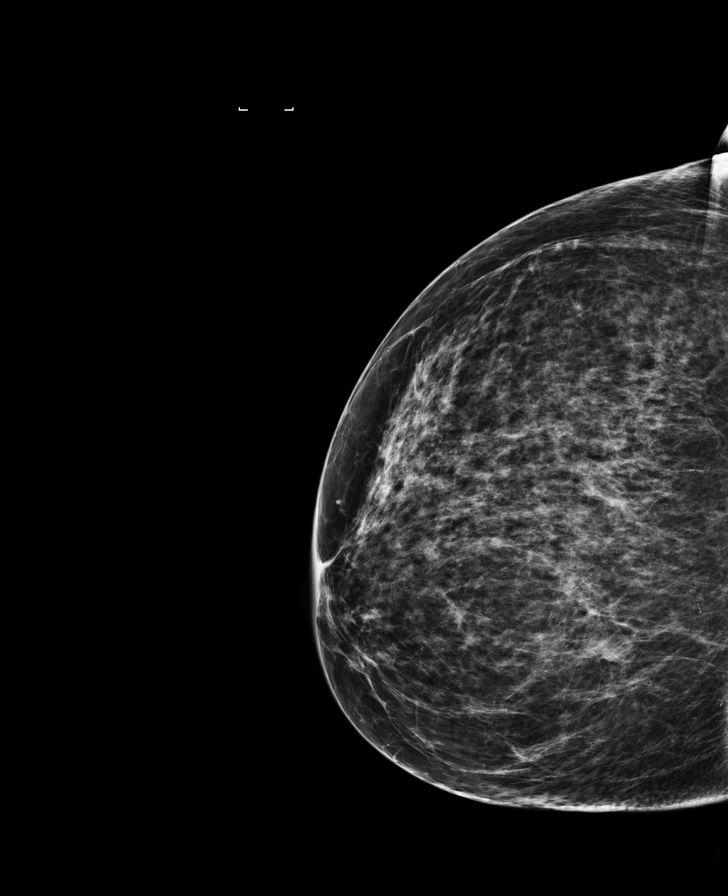

[L CC synth-2D]
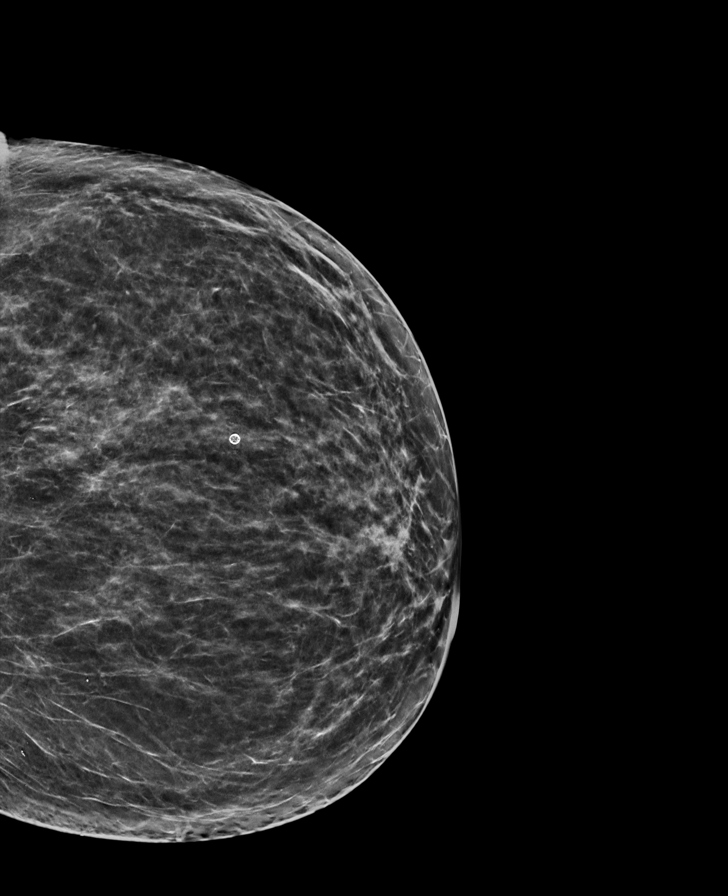

[R CC synth-2D]
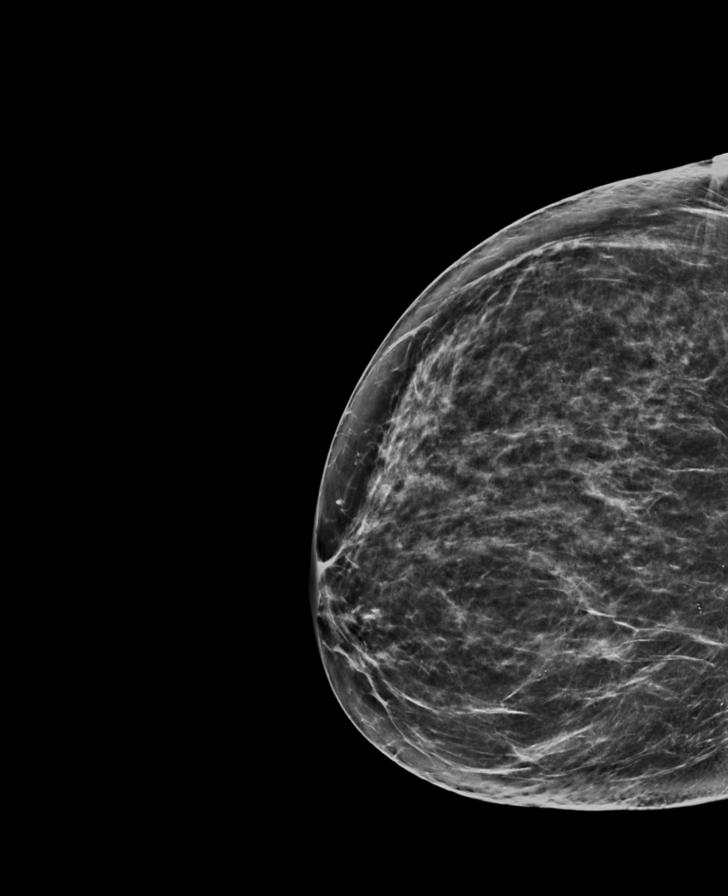

[L CC]
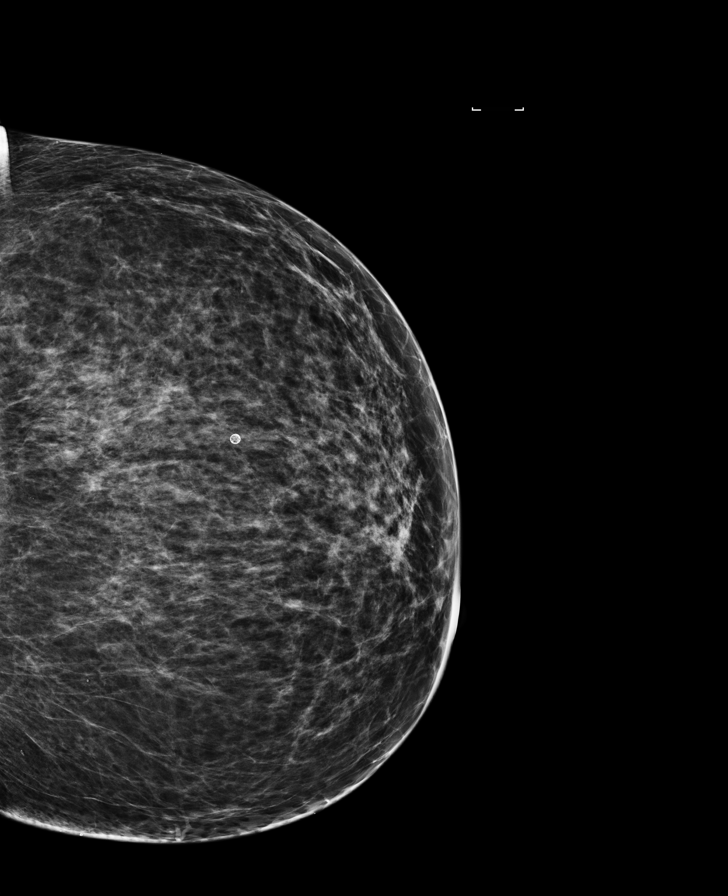

[R MLO synth-2D]
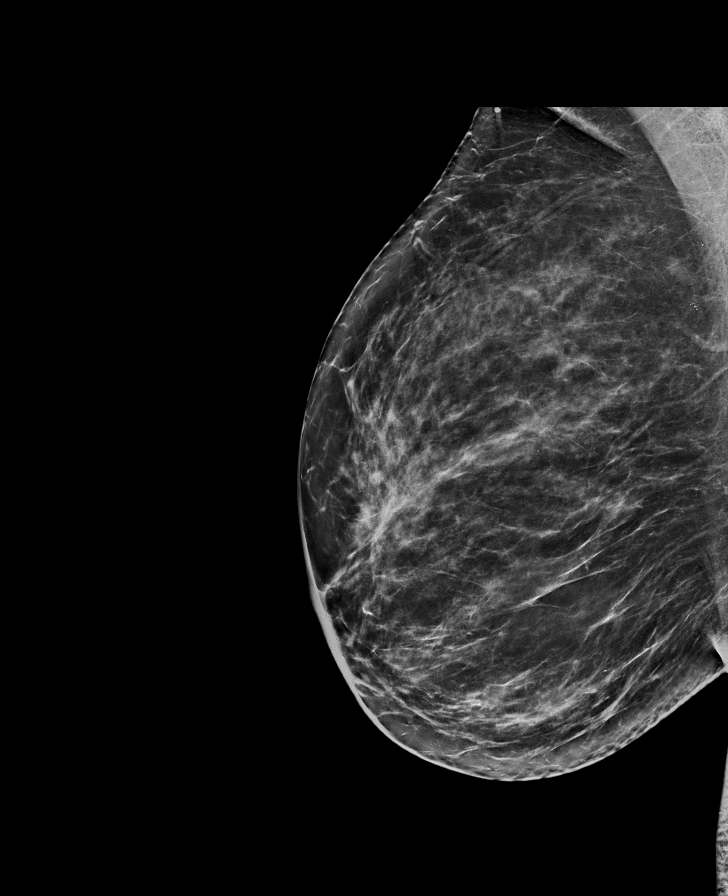

[8 of 28 positions shown; findings below may reference images not displayed]

ACR Breast Density Category c: The breast tissue is heterogeneously
dense, which may obscure small masses.
FINDINGS: There are no findings suspicious for malignancy. Images were
processed with CAD.
IMPRESSION: No mammographic evidence of malignancy. A result letter of this
screening mammogram will be mailed directly to the patient.

RECOMMENDATION:
Screening mammogram in one year. (Code:[id number])

BI-RADS CATEGORY  1: Negative.
